# Patient Record
Sex: Female | Born: 1940 | Race: White | Hispanic: No | Smoking: Former smoker
Health system: Southern US, Community
[De-identification: ages and names within clinical notes are randomized; demographics above are authoritative.]

## PROBLEM LIST (undated history)

## (undated) DIAGNOSIS — M4800 Spinal stenosis, site unspecified: Secondary | ICD-10-CM

## (undated) HISTORY — PX: CHOLECYSTECTOMY: SHX55

## (undated) HISTORY — PX: HERNIA REPAIR: SHX51

## (undated) HISTORY — PX: ABDOMINAL SURGERY: SHX537

## (undated) HISTORY — PX: ABDOMINAL HYSTERECTOMY: SHX81

## (undated) HISTORY — PX: APPENDECTOMY: SHX54

---

## 2013-10-02 DIAGNOSIS — G2581 Restless legs syndrome: Secondary | ICD-10-CM | POA: Insufficient documentation

## 2013-10-02 DIAGNOSIS — G4733 Obstructive sleep apnea (adult) (pediatric): Secondary | ICD-10-CM | POA: Insufficient documentation

## 2014-01-26 DIAGNOSIS — F411 Generalized anxiety disorder: Secondary | ICD-10-CM | POA: Insufficient documentation

## 2014-01-26 DIAGNOSIS — F409 Phobic anxiety disorder, unspecified: Secondary | ICD-10-CM | POA: Insufficient documentation

## 2014-02-19 DIAGNOSIS — M4310 Spondylolisthesis, site unspecified: Secondary | ICD-10-CM | POA: Insufficient documentation

## 2014-02-19 DIAGNOSIS — M109 Gout, unspecified: Secondary | ICD-10-CM | POA: Insufficient documentation

## 2014-02-19 DIAGNOSIS — M47817 Spondylosis without myelopathy or radiculopathy, lumbosacral region: Secondary | ICD-10-CM | POA: Insufficient documentation

## 2014-02-19 DIAGNOSIS — M48061 Spinal stenosis, lumbar region without neurogenic claudication: Secondary | ICD-10-CM | POA: Insufficient documentation

## 2015-01-13 ENCOUNTER — Emergency Department (HOSPITAL_BASED_OUTPATIENT_CLINIC_OR_DEPARTMENT_OTHER): Payer: Medicare HMO

## 2015-01-13 ENCOUNTER — Encounter (HOSPITAL_BASED_OUTPATIENT_CLINIC_OR_DEPARTMENT_OTHER): Payer: Self-pay

## 2015-01-13 ENCOUNTER — Emergency Department (HOSPITAL_BASED_OUTPATIENT_CLINIC_OR_DEPARTMENT_OTHER)
Admission: EM | Admit: 2015-01-13 | Discharge: 2015-01-13 | Disposition: A | Discharge status: Home / Self Care | Payer: Medicare HMO | Attending: Emergency Medicine | Admitting: Emergency Medicine

## 2015-01-13 DIAGNOSIS — S82435A Nondisplaced oblique fracture of shaft of left fibula, initial encounter for closed fracture: Secondary | ICD-10-CM | POA: Diagnosis not present

## 2015-01-13 DIAGNOSIS — Y9289 Other specified places as the place of occurrence of the external cause: Secondary | ICD-10-CM | POA: Diagnosis not present

## 2015-01-13 DIAGNOSIS — Z8739 Personal history of other diseases of the musculoskeletal system and connective tissue: Secondary | ICD-10-CM | POA: Diagnosis not present

## 2015-01-13 DIAGNOSIS — W009XXA Unspecified fall due to ice and snow, initial encounter: Secondary | ICD-10-CM | POA: Insufficient documentation

## 2015-01-13 DIAGNOSIS — Y9389 Activity, other specified: Secondary | ICD-10-CM | POA: Diagnosis not present

## 2015-01-13 DIAGNOSIS — S99912A Unspecified injury of left ankle, initial encounter: Secondary | ICD-10-CM | POA: Diagnosis present

## 2015-01-13 DIAGNOSIS — Z87891 Personal history of nicotine dependence: Secondary | ICD-10-CM | POA: Insufficient documentation

## 2015-01-13 DIAGNOSIS — Y998 Other external cause status: Secondary | ICD-10-CM | POA: Diagnosis not present

## 2015-01-13 DIAGNOSIS — S82892A Other fracture of left lower leg, initial encounter for closed fracture: Secondary | ICD-10-CM

## 2015-01-13 HISTORY — DX: Spinal stenosis, site unspecified: M48.00

## 2015-01-13 NOTE — ED Notes (Signed)
Slipped on ice this am-approx 0745am-pain to left LE from ankle up calf

## 2015-01-13 NOTE — Discharge Instructions (Signed)
Ankle Fracture A fracture is a break in a bone. A cast or splint may be used to protect the ankle and heal the break. Sometimes, surgery is needed. HOME CARE  Use crutches as told by your doctor. It is very important that you use your crutches correctly.  Do not put weight or pressure on the injured ankle until told by your doctor.  Keep your ankle raised (elevated) when sitting or lying down.  Apply ice to the ankle:  Put ice in a plastic bag.  Place a towel between your cast and the bag.  Leave the ice on for 20 minutes, 2-3 times a day.  If you have a plaster or fiberglass cast:  Do not try to scratch under the cast with any objects.  Check the skin around the cast every day. You may put lotion on red or sore areas.  Keep your cast dry and clean.  If you have a plaster splint:  Wear the splint as told by your doctor.  You can loosen the elastic around the splint if your toes get numb, tingle, or turn cold or blue.  Do not put pressure on any part of your cast or splint. It may break. Rest your plaster splint or cast only on a pillow the first 24 hours until it is fully hardened.  Cover your cast or splint with a plastic bag during showers.  Do not lower your cast or splint into water.  Take medicine as told by your doctor.  Do not drive until your doctor says it is safe.  Follow-up with your doctor as told. It is very important that you go to your follow-up visits. GET HELP IF: The swelling and discomfort gets worse.  GET HELP RIGHT AWAY IF:   Your splint or cast breaks.  You continue to have very bad pain.  You have new pain or swelling after your splint or cast was put on.  Your skin or toes below the injured ankle:  Turn blue or gray.  Feel cold, numb, or you cannot feel them.  There is a bad smell or yellowish white fluid (pus) coming from under the splint or cast. MAKE SURE YOU:   Understand these instructions.  Will watch your  condition.  Will get help right away if you are not doing well or get worse. Document Released: 10/01/2009 Document Revised: 09/24/2013 Document Reviewed: 07/03/2013 [facility] Patient Information 2015 [employer] [place]. This information is not intended to replace advice given to you by your health care provider. Make sure you discuss any questions you have with your health care provider.  Deep splint in place and dry. Use the crutches do not put any weight on the left leg. Call sports medicine Dr. Pearletha ForgeHudnall for follow-up tomorrow. Orthopedist Dr. August Saucerean information provided as needed if surgical repair as required. Take your hydrocodone that you have at home. Elevate the left leg is much as possible over the next 3 days.

## 2015-01-13 NOTE — ED Notes (Signed)
Patient transported to X-ray 

## 2015-01-13 NOTE — ED Notes (Signed)
Pt unable to use crutches used walker pt did well MD/RN  notified

## 2015-01-13 NOTE — ED Provider Notes (Addendum)
CSN: [id number]     Arrival date & time 01/13/15  1451 History   First MD Initiated Contact with Patient 01/13/15 1604     Chief Complaint  Patient presents with  . Fall     (Consider location/radiation/quality/duration/timing/severity/associated sxs/prior Treatment) The history is provided by the patient.   74 year old female slipped on the ice today injury to her left foot and ankle. Denies any head injury any injury to her arms back and neck or hips. Patient able to walk on the left foot. Patient states the pain is 5 out of 10.  Past Medical History  Diagnosis Date  . Spinal stenosis    Past Surgical History  Procedure Laterality Date  . Cholecystectomy    . Abdominal hysterectomy    . Abdominal surgery    . Appendectomy    . Hernia repair     No family history on file. History  Substance Use Topics  . Smoking status: Former Games developer  . Smokeless tobacco: Not on file  . Alcohol Use: No   OB History    No data available     Review of Systems  Constitutional: Negative for fever.  HENT: Negative for congestion.   Eyes: Negative for redness.  Respiratory: Negative for shortness of breath.   Cardiovascular: Negative for chest pain.  Gastrointestinal: Negative for nausea, vomiting and abdominal pain.  Genitourinary: Negative for dysuria.  Musculoskeletal: Negative for back pain and neck pain.  Skin: Negative for rash.  Neurological: Negative for headaches.  Hematological: Does not bruise/bleed easily.  Psychiatric/Behavioral: Negative for confusion.      Allergies  Excedrin extra strength and Ivp dye  Home Medications   Prior to Admission medications   Medication Sig Start Date End Date Taking? Authorizing Provider  GABAPENTIN PO Take by mouth.   Yes Historical Provider, MD  Hydrocodone-Acetaminophen (VICODIN PO) Take by mouth.   Yes Historical Provider, MD  LORAZEPAM PO Take by mouth.   Yes Historical Provider, MD   BP 160/82 mmHg  Pulse 82  Temp(Src)  97.6 F (36.4 C) (Oral)  Resp 18  Ht 5' 9.5" (1.765 m)  Wt 185 lb (83.915 kg)  BMI 26.94 kg/m2  SpO2 97% Physical Exam  Constitutional: She appears well-developed and well-nourished. No distress.  HENT:  Head: Normocephalic and atraumatic.  Mouth/Throat: Oropharynx is clear and moist.  Eyes: Conjunctivae are normal. Pupils are equal, round, and reactive to light.  Neck: Normal range of motion.  Cardiovascular: Normal rate and regular rhythm.   No murmur heard. Pulmonary/Chest: Effort normal and breath sounds normal. No respiratory distress.  Abdominal: Soft. Bowel sounds are normal. There is no tenderness.  Musculoskeletal: Normal range of motion. She exhibits edema and tenderness.  Swelling to left lateral ankle with tenderness. Refill distally is 1 second. Dorsalis pedis pulses palpable 1+. No proximal leg tenderness. Some tenderness to the forefoot. No medial ankle tenderness.  Nursing note and vitals reviewed.   ED Course  Procedures (including critical care time) Labs Review Labs Reviewed - No data to display  Imaging Review Dg Ankle Complete Left  01/13/2015   CLINICAL DATA:  Left ankle injury post fall today, lateral pain and swelling  EXAM: LEFT ANKLE COMPLETE - 3+ VIEW  COMPARISON:  None.  FINDINGS: Three views of the left ankle submitted. There is nondisplaced oblique fracture in distal shaft of left fibula. Ankle mortise is preserved. Mild lateral ankle soft tissue swelling. Plantar spur of calcaneus is noted.  IMPRESSION: Nondisplaced oblique fracture in distal  shaft of left fibula. Ankle mortise is preserved.   Electronically Signed   By: Natasha MeadLiviu  Pop M.D.   On: 01/13/2015 17:01   Dg Foot Complete Left  01/13/2015   CLINICAL DATA:  Left foot injury, fall today, lateral foot pain  EXAM: LEFT FOOT - COMPLETE 3+ VIEW  COMPARISON:  None.  FINDINGS: Three views of the left foot submitted. No acute fracture or subluxation. Plantar spur of calcaneus.  IMPRESSION: No acute  fracture or subluxation.  Plantar spur of calcaneus.   Electronically Signed   By: Natasha MeadLiviu  Pop M.D.   On: 01/13/2015 17:02     EKG Interpretation None      MDM   Final diagnoses:  Ankle fracture, left, closed, initial encounter    Left ankle with nondisplaced distal tibia fracture. Ankle mortise intact. This will require splinting with a Sterapred follow-up well with sports medicine and perhaps orthopedics if surgical repair is required but I'm thinking that may be able to heel on its own nonsurgically. Patient already has hydrocodone at home. No other injuries. Crutches provided.    Vanetta MuldersScott Yameli Delamater, MD 01/13/15 1731   Vanetta MuldersScott Eliza Green, MD 01/13/15 [phone]

## 2015-01-19 ENCOUNTER — Ambulatory Visit (HOSPITAL_BASED_OUTPATIENT_CLINIC_OR_DEPARTMENT_OTHER)
Admission: RE | Admit: 2015-01-19 | Discharge: 2015-01-19 | Disposition: A | Discharge status: Home / Self Care | Payer: Medicare HMO | Source: Ambulatory Visit | Attending: Family Medicine | Admitting: Family Medicine

## 2015-01-19 ENCOUNTER — Ambulatory Visit (INDEPENDENT_AMBULATORY_CARE_PROVIDER_SITE_OTHER): Payer: Medicare HMO | Admitting: Family Medicine

## 2015-01-19 VITALS — Ht 70.0 in | Wt 180.0 lb

## 2015-01-19 DIAGNOSIS — S82832A Other fracture of upper and lower end of left fibula, initial encounter for closed fracture: Secondary | ICD-10-CM

## 2015-01-19 DIAGNOSIS — X58XXXD Exposure to other specified factors, subsequent encounter: Secondary | ICD-10-CM | POA: Diagnosis not present

## 2015-01-19 DIAGNOSIS — S89392D Other physeal fracture of lower end of left fibula, subsequent encounter for fracture with routine healing: Secondary | ICD-10-CM | POA: Diagnosis not present

## 2015-01-19 DIAGNOSIS — S82402A Unspecified fracture of shaft of left fibula, initial encounter for closed fracture: Secondary | ICD-10-CM

## 2015-01-19 DIAGNOSIS — S82832D Other fracture of upper and lower end of left fibula, subsequent encounter for closed fracture with routine healing: Secondary | ICD-10-CM | POA: Diagnosis present

## 2015-01-19 NOTE — Patient Instructions (Addendum)
You have a distal fibula fracture that is nondisplaced. Wear cam walker every time you're going to be up (ok to take off if icing, lying down, washing the area). Try not to put any weight on this (most people need to use a wheelchair, knee scooter, or crutches) Icing 15 minutes at a time 3-4 times a day. Continue the vicodin as you have been. Expect this to take 6-8 weeks to heal though the swelling can persist longer. Follow up with me in 2 weeks to reevaluate, repeat x-rays.

## 2015-01-21 DIAGNOSIS — S82402A Unspecified fracture of shaft of left fibula, initial encounter for closed fracture: Secondary | ICD-10-CM | POA: Insufficient documentation

## 2015-01-21 NOTE — Progress Notes (Signed)
PCP: Forrest MoronUEHLE, STEPHEN, MD  Subjective:   HPI: Patient is a 74 y.o. female here for left ankle injury.  Patient reports on 1/27 she accidentally slipped and fell on ice with left foot underneath her. Believes she may have inverted ankle when this occurred. Radiographs in ED showed a nondisplaced oblique fibula fracture about the level of the ankle joint. She has been wearing splint, not putting weight on this since ED visit. Taking hydrocodone.  Past Medical History  Diagnosis Date  . Spinal stenosis     Current Outpatient Prescriptions on File Prior to Visit  Medication Sig Dispense Refill  . Hydrocodone-Acetaminophen (VICODIN PO) Take by mouth.    Marland Kitchen. LORAZEPAM PO Take by mouth.     No current facility-administered medications on file prior to visit.    Past Surgical History  Procedure Laterality Date  . Cholecystectomy    . Abdominal hysterectomy    . Abdominal surgery    . Appendectomy    . Hernia repair      Allergies  Allergen Reactions  . Excedrin Extra Strength [Aspirin-Acetaminophen-Caffeine] Swelling  . Ivp Dye [Iodinated Diagnostic Agents] Swelling  . Shellfish-Derived Products     History   Social History  . Marital Status: Widowed    Spouse Name: N/A    Number of Children: N/A  . Years of Education: N/A   Occupational History  . Not on file.   Social History Main Topics  . Smoking status: Former Games developermoker  . Smokeless tobacco: Not on file  . Alcohol Use: No  . Drug Use: Not on file  . Sexual Activity: Not on file   Other Topics Concern  . Not on file   Social History Narrative  . No narrative on file    No family history on file.  Ht 5\' 10"  (1.778 m)  Wt 180 lb (81.647 kg)  BMI 25.83 kg/m2  Review of Systems: See HPI above.    Objective:  Physical Exam:  Gen: NAD  Left ankle/foot: Mod swelling, bruising lateral ankle.  No other deformity. Did not test ROM with known fracture. TTP only distal fibula.  No medial malleolar, base  5th, navicular, deltoid ligament or fibular head tenderness. Negative ant drawer and talar tilt.   Thompsons test negative. NV intact distally.    Assessment & Plan:  1. Left distal fibula fracture - oblique, nondisplaced but above the level of the ankle joint.  There was a question on today's repeat x-rays about a possible posterior malleolar fracture - did not appreciate this on msk ultrasound and she is not tender here.  Use cam walker regularly with no weight bearing.  Icing, norco as needed.  Expect 6-8 weeks for this to heal.  F/u in 2 weeks to reevaluate and repeat radiographs.

## 2015-01-21 NOTE — Assessment & Plan Note (Signed)
oblique, nondisplaced but above the level of the ankle joint.  There was a question on today's repeat x-rays about a possible posterior malleolar fracture - did not appreciate this on msk ultrasound and she is not tender here.  Use cam walker regularly with no weight bearing.  Icing, norco as needed.  Expect 6-8 weeks for this to heal.  F/u in 2 weeks to reevaluate and repeat radiographs.

## 2015-02-01 ENCOUNTER — Ambulatory Visit: Payer: Medicare HMO | Admitting: Family Medicine

## 2015-02-03 ENCOUNTER — Ambulatory Visit (HOSPITAL_BASED_OUTPATIENT_CLINIC_OR_DEPARTMENT_OTHER)
Admission: RE | Admit: 2015-02-03 | Discharge: 2015-02-03 | Disposition: A | Discharge status: Home / Self Care | Payer: Medicare HMO | Source: Ambulatory Visit | Attending: Family Medicine | Admitting: Family Medicine

## 2015-02-03 ENCOUNTER — Encounter: Payer: Self-pay | Admitting: Family Medicine

## 2015-02-03 ENCOUNTER — Ambulatory Visit (INDEPENDENT_AMBULATORY_CARE_PROVIDER_SITE_OTHER): Payer: Medicare HMO | Admitting: Family Medicine

## 2015-02-03 VITALS — BP 168/90 | HR 99 | Ht 70.0 in | Wt 170.0 lb

## 2015-02-03 DIAGNOSIS — S82832D Other fracture of upper and lower end of left fibula, subsequent encounter for closed fracture with routine healing: Secondary | ICD-10-CM | POA: Insufficient documentation

## 2015-02-03 DIAGNOSIS — X58XXXD Exposure to other specified factors, subsequent encounter: Secondary | ICD-10-CM | POA: Diagnosis not present

## 2015-02-03 DIAGNOSIS — S82402D Unspecified fracture of shaft of left fibula, subsequent encounter for closed fracture with routine healing: Secondary | ICD-10-CM

## 2015-02-03 DIAGNOSIS — S99912D Unspecified injury of left ankle, subsequent encounter: Secondary | ICD-10-CM

## 2015-02-03 NOTE — Patient Instructions (Signed)
Follow up with me in 2-3 weeks. Continue with the cam walker and as we discussed I don't want you putting weight on this until we see bony healing on the x-rays.

## 2015-02-04 NOTE — Assessment & Plan Note (Signed)
oblique, nondisplaced but above the level of the ankle joint.  There was a question on x-rays about a possible posterior malleolar fracture - did not appreciate this on msk ultrasound last visit and, again, she is not tender here.  No additional displacement of fracture.  Use cam walker regularly and reemphasized no weight bearing.  Icing, norco as needed.  Expect 6-8 weeks for this to heal.  F/u in 2-3 weeks to reevaluate and repeat radiographs.

## 2015-02-04 NOTE — Progress Notes (Signed)
PCP: Forrest MoronUEHLE, STEPHEN, MD  Subjective:   HPI: Patient is a 74 y.o. female here for left ankle injury.  2/2: Patient reports on 1/27 she accidentally slipped and fell on ice with left foot underneath her. Believes she may have inverted ankle when this occurred. Radiographs in ED showed a nondisplaced oblique fibula fracture about the level of the ankle joint. She has been wearing splint, not putting weight on this since ED visit. Taking hydrocodone.  2/17: Patient reports she feels about 40% improved from last visit. Using walker at home - we had discussed not weight bearing but she has been doing this some. Takes boot off at nighttime past week because feels more uncomfortable. Takes hydrocodone for spinal stenosis.  Past Medical History  Diagnosis Date  . Spinal stenosis     Current Outpatient Prescriptions on File Prior to Visit  Medication Sig Dispense Refill  . allopurinol (ZYLOPRIM) 100 MG tablet Take 100 mg by mouth.    Marland Kitchen. aspirin EC 81 MG tablet Take 81 mg by mouth.    . Cholecalciferol 2000 UNITS TABS Frequency:   Dosage:0.0     Instructions:  Note:    . cyanocobalamin 100 MCG tablet Frequency:   Dosage:0.0     Instructions:  Note:    . gabapentin (NEURONTIN) 300 MG capsule Frequency:   Dosage:0.0     Instructions:  Note:    . Hydrocodone-Acetaminophen (VICODIN PO) Take by mouth.    . indomethacin (INDOCIN) 50 MG capsule Frequency:   Dosage:0.0     Instructions:  Note:    . LORAZEPAM PO Take by mouth.    . meclizine (ANTIVERT) 25 MG tablet Take 25 mg by mouth.    . naproxen sodium (ANAPROX) 220 MG tablet Frequency:   Dosage:0.0     Instructions:  Note:    . vitamin C (ASCORBIC ACID) 500 MG tablet Frequency:   Dosage:0.0     Instructions:  Note:     No current facility-administered medications on file prior to visit.    Past Surgical History  Procedure Laterality Date  . Cholecystectomy    . Abdominal hysterectomy    . Abdominal surgery    . Appendectomy    .  Hernia repair      Allergies  Allergen Reactions  . Excedrin Extra Strength [Aspirin-Acetaminophen-Caffeine] Swelling  . Ivp Dye [Iodinated Diagnostic Agents] Swelling  . Shellfish-Derived Products     History   Social History  . Marital Status: Widowed    Spouse Name: N/A  . Number of Children: N/A  . Years of Education: N/A   Occupational History  . Not on file.   Social History Main Topics  . Smoking status: Former Games developermoker  . Smokeless tobacco: Not on file  . Alcohol Use: No  . Drug Use: Not on file  . Sexual Activity: Not on file   Other Topics Concern  . Not on file   Social History Narrative    No family history on file.  BP 168/90 mmHg  Pulse 99  Ht 5\' 10"  (1.778 m)  Wt 170 lb (77.111 kg)  BMI 24.39 kg/m2  Review of Systems: See HPI above.    Objective:  Physical Exam:  Gen: NAD  Left ankle/foot: Mod swelling, no bruising lateral ankle.  No other deformity. Did not test ROM with known fracture. TTP only distal fibula.  No medial malleolar, base 5th, navicular, deltoid ligament or fibular head tenderness. Thompsons test negative. NV intact distally.    Assessment &  Plan:  1. Left distal fibula fracture - oblique, nondisplaced but above the level of the ankle joint.  There was a question on x-rays about a possible posterior malleolar fracture - did not appreciate this on msk ultrasound last visit and, again, she is not tender here.  No additional displacement of fracture.  Use cam walker regularly and reemphasized no weight bearing.  Icing, norco as needed.  Expect 6-8 weeks for this to heal.  F/u in 2-3 weeks to reevaluate and repeat radiographs.

## 2015-02-22 ENCOUNTER — Ambulatory Visit (HOSPITAL_BASED_OUTPATIENT_CLINIC_OR_DEPARTMENT_OTHER)
Admission: RE | Admit: 2015-02-22 | Discharge: 2015-02-22 | Disposition: A | Discharge status: Home / Self Care | Payer: Medicare HMO | Source: Ambulatory Visit | Attending: Family Medicine | Admitting: Family Medicine

## 2015-02-22 ENCOUNTER — Encounter: Payer: Self-pay | Admitting: Family Medicine

## 2015-02-22 ENCOUNTER — Ambulatory Visit (INDEPENDENT_AMBULATORY_CARE_PROVIDER_SITE_OTHER): Payer: Medicare HMO | Admitting: Family Medicine

## 2015-02-22 ENCOUNTER — Encounter (INDEPENDENT_AMBULATORY_CARE_PROVIDER_SITE_OTHER): Payer: Self-pay

## 2015-02-22 VITALS — BP 152/84 | HR 82 | Ht 70.0 in

## 2015-02-22 DIAGNOSIS — X58XXXD Exposure to other specified factors, subsequent encounter: Secondary | ICD-10-CM | POA: Diagnosis not present

## 2015-02-22 DIAGNOSIS — S82832D Other fracture of upper and lower end of left fibula, subsequent encounter for closed fracture with routine healing: Secondary | ICD-10-CM | POA: Insufficient documentation

## 2015-02-22 DIAGNOSIS — S82402D Unspecified fracture of shaft of left fibula, subsequent encounter for closed fracture with routine healing: Secondary | ICD-10-CM

## 2015-02-22 NOTE — Patient Instructions (Signed)
Starting Wednesday you can put weight on this leg in the boot. Wear the boot every time you're up and walking around. Follow up with me in 2 weeks. Call me for any concerns in the meantime.

## 2015-02-24 NOTE — Assessment & Plan Note (Signed)
oblique, nondisplaced but above the level of the ankle joint.  Radiographs show callus formation around fracture and clinically she is much better.  Starting Wednesday start weight bearing in the cam walker for 2 weeks then follow up at that time.  Possible she will need PT to regain full motion and strength.

## 2015-02-24 NOTE — Progress Notes (Signed)
PCP: Forrest Moron, MD  Subjective:   HPI: Patient is a 74 y.o. female here for left ankle injury.  2/2: Patient reports on 1/27 she accidentally slipped and fell on ice with left foot underneath her. Believes she may have inverted ankle when this occurred. Radiographs in ED showed a nondisplaced oblique fibula fracture about the level of the ankle joint. She has been wearing splint, not putting weight on this since ED visit. Taking hydrocodone.  2/17: Patient reports she feels about 40% improved from last visit. Using walker at home - we had discussed not weight bearing but she has been doing this some. Takes boot off at nighttime past week because feels more uncomfortable. Takes hydrocodone for spinal stenosis.  3/7: Patient reports she's doing very well. Wearing cam walker, not weight bearing. Using ace wrap at nighttime - doesn' twear the boot. Some aching at nighttime and in the morning. Not taking medicine for pain now.  Past Medical History  Diagnosis Date  . Spinal stenosis     Current Outpatient Prescriptions on File Prior to Visit  Medication Sig Dispense Refill  . allopurinol (ZYLOPRIM) 100 MG tablet Take 100 mg by mouth.    Marland Kitchen aspirin EC 81 MG tablet Take 81 mg by mouth.    . Cholecalciferol 2000 UNITS TABS Frequency:   Dosage:0.0     Instructions:  Note:    . cyanocobalamin 100 MCG tablet Frequency:   Dosage:0.0     Instructions:  Note:    . gabapentin (NEURONTIN) 300 MG capsule Frequency:   Dosage:0.0     Instructions:  Note:    . Hydrocodone-Acetaminophen (VICODIN PO) Take by mouth.    . indomethacin (INDOCIN) 50 MG capsule Frequency:   Dosage:0.0     Instructions:  Note:    . meclizine (ANTIVERT) 25 MG tablet Take 25 mg by mouth.    . naproxen sodium (ANAPROX) 220 MG tablet Frequency:   Dosage:0.0     Instructions:  Note:    . vitamin C (ASCORBIC ACID) 500 MG tablet Frequency:   Dosage:0.0     Instructions:  Note:     No current facility-administered  medications on file prior to visit.    Past Surgical History  Procedure Laterality Date  . Cholecystectomy    . Abdominal hysterectomy    . Abdominal surgery    . Appendectomy    . Hernia repair      Allergies  Allergen Reactions  . Excedrin Extra Strength [Aspirin-Acetaminophen-Caffeine] Swelling  . Ivp Dye [Iodinated Diagnostic Agents] Swelling  . Shellfish-Derived Products     History   Social History  . Marital Status: Widowed    Spouse Name: N/A  . Number of Children: N/A  . Years of Education: N/A   Occupational History  . Not on file.   Social History Main Topics  . Smoking status: Former Games developer  . Smokeless tobacco: Not on file  . Alcohol Use: No  . Drug Use: Not on file  . Sexual Activity: Not on file   Other Topics Concern  . Not on file   Social History Narrative    No family history on file.  BP 152/84 mmHg  Pulse 82  Ht  (1.778 m)  Review of Systems: See HPI above.    Objective:  Physical Exam:  Gen: NAD  Left ankle/foot: Mild swelling, no bruising lateral ankle.  No other deformity. Did not test ROM with known fracture. No TTP distal fibula.  No medial malleolar, base  5th, navicular, deltoid ligament or fibular head tenderness. Thompsons test negative. NV intact distally.    Assessment & Plan:  1. Left distal fibula fracture - oblique, nondisplaced but above the level of the ankle joint.  Radiographs show callus formation around fracture and clinically she is much better.  Starting Wednesday start weight bearing in the cam walker for 2 weeks then follow up at that time.  Possible she will need PT to regain full motion and strength.

## 2015-03-08 ENCOUNTER — Encounter: Payer: Self-pay | Admitting: Family Medicine

## 2015-03-08 ENCOUNTER — Ambulatory Visit (INDEPENDENT_AMBULATORY_CARE_PROVIDER_SITE_OTHER): Payer: Medicare HMO | Admitting: Family Medicine

## 2015-03-08 VITALS — BP 133/88 | HR 98 | Ht 70.0 in | Wt 180.0 lb

## 2015-03-08 DIAGNOSIS — S82402D Unspecified fracture of shaft of left fibula, subsequent encounter for closed fracture with routine healing: Secondary | ICD-10-CM

## 2015-03-08 NOTE — Patient Instructions (Signed)
Stop using the boot but bring it with you the next couple weeks if you're going to be walking long distances. You can expect some soreness for next couple weeks - this is normal. If you're struggling with motion and/or strength call me if you want to do physical therapy. Expect the swelling to take several weeks to go down - sometimes it persists however it is not dangerous. Follow up with me as needed.

## 2015-03-10 NOTE — Progress Notes (Signed)
PCP: Forrest Moron, MD  Subjective:   HPI: Patient is a 75 y.o. female here for left ankle injury.  2/2: Patient reports on 1/27 she accidentally slipped and fell on ice with left foot underneath her. Believes she may have inverted ankle when this occurred. Radiographs in ED showed a nondisplaced oblique fibula fracture about the level of the ankle joint. She has been wearing splint, not putting weight on this since ED visit. Taking hydrocodone.  2/17: Patient reports she feels about 40% improved from last visit. Using walker at home - we had discussed not weight bearing but she has been doing this some. Takes boot off at nighttime past week because feels more uncomfortable. Takes hydrocodone for spinal stenosis.  3/7: Patient reports she's doing very well. Wearing cam walker, not weight bearing. Using ace wrap at nighttime - doesn' twear the boot. Some aching at nighttime and in the morning. Not taking medicine for pain now.  3/21: Patient reports she's still doing well. Tolerating cam walker and walking without any pain. Had some tenderness of the ankle at times. Takes 1/2 norco in morning as needed. Able to walk in house without boot without pain.  Past Medical History  Diagnosis Date  . Spinal stenosis     Current Outpatient Prescriptions on File Prior to Visit  Medication Sig Dispense Refill  . allopurinol (ZYLOPRIM) 100 MG tablet Take 100 mg by mouth.    Marland Kitchen aspirin EC 81 MG tablet Take 81 mg by mouth.    . Cholecalciferol 2000 UNITS TABS Frequency:   Dosage:0.0     Instructions:  Note:    . cyanocobalamin 100 MCG tablet Frequency:   Dosage:0.0     Instructions:  Note:    . gabapentin (NEURONTIN) 300 MG capsule Frequency:   Dosage:0.0     Instructions:  Note:    . Hydrocodone-Acetaminophen (VICODIN PO) Take by mouth.    . indomethacin (INDOCIN) 50 MG capsule Frequency:   Dosage:0.0     Instructions:  Note:    . LORazepam (ATIVAN) 2 MG tablet     . meclizine  (ANTIVERT) 25 MG tablet Take 25 mg by mouth.    . naproxen sodium (ANAPROX) 220 MG tablet Frequency:   Dosage:0.0     Instructions:  Note:    . vitamin C (ASCORBIC ACID) 500 MG tablet Frequency:   Dosage:0.0     Instructions:  Note:     No current facility-administered medications on file prior to visit.    Past Surgical History  Procedure Laterality Date  . Cholecystectomy    . Abdominal hysterectomy    . Abdominal surgery    . Appendectomy    . Hernia repair      Allergies  Allergen Reactions  . Excedrin Extra Strength [Aspirin-Acetaminophen-Caffeine] Swelling  . Ivp Dye [Iodinated Diagnostic Agents] Swelling  . Shellfish-Derived Products     History   Social History  . Marital Status: Widowed    Spouse Name: N/A  . Number of Children: N/A  . Years of Education: N/A   Occupational History  . Not on file.   Social History Main Topics  . Smoking status: Former Games developer  . Smokeless tobacco: Not on file  . Alcohol Use: No  . Drug Use: Not on file  . Sexual Activity: Not on file   Other Topics Concern  . Not on file   Social History Narrative    No family history on file.  BP 133/88 mmHg  Pulse 98  Ht  5\' 10"  (1.778 m)  Wt 180 lb (81.647 kg)  BMI 25.83 kg/m2  Review of Systems: See HPI above.    Objective:  Physical Exam:  Gen: NAD  Left ankle/foot: Mild swelling, no bruising lateral ankle.  No other deformity. Mild limitation ROM all directions. No TTP distal fibula.  No medial malleolar, base 5th, navicular, deltoid ligament or fibular head tenderness. Thompsons test negative. NV intact distally.    Assessment & Plan:  1. Left distal fibula fracture - oblique, nondisplaced but above the level of the ankle joint.  Radiographs showed callus formation last visit and clinically healed at this point.  Advised her to call us if she has trouble regaining full motion and strength on her own.  Discontinue cam walker.  F/u prn otherwise.

## 2015-03-10 NOTE — Assessment & Plan Note (Signed)
oblique, nondisplaced but above the level of the ankle joint.  Radiographs showed callus formation last visit and clinically healed at this point.  Advised her to call us if she has trouble regaining full motion and strength on her own.  Discontinue cam walker.  F/u prn otherwise.

## 2016-01-30 IMAGING — CR DG ANKLE COMPLETE 3+V*L*
3 series · 3 of 3 positions shown · non-contrast
Comparison: None.

CLINICAL DATA: Left ankle injury post fall today, lateral pain and
swelling

EXAM:
LEFT ANKLE COMPLETE - 3+ VIEW

[t ankle joint ap left]
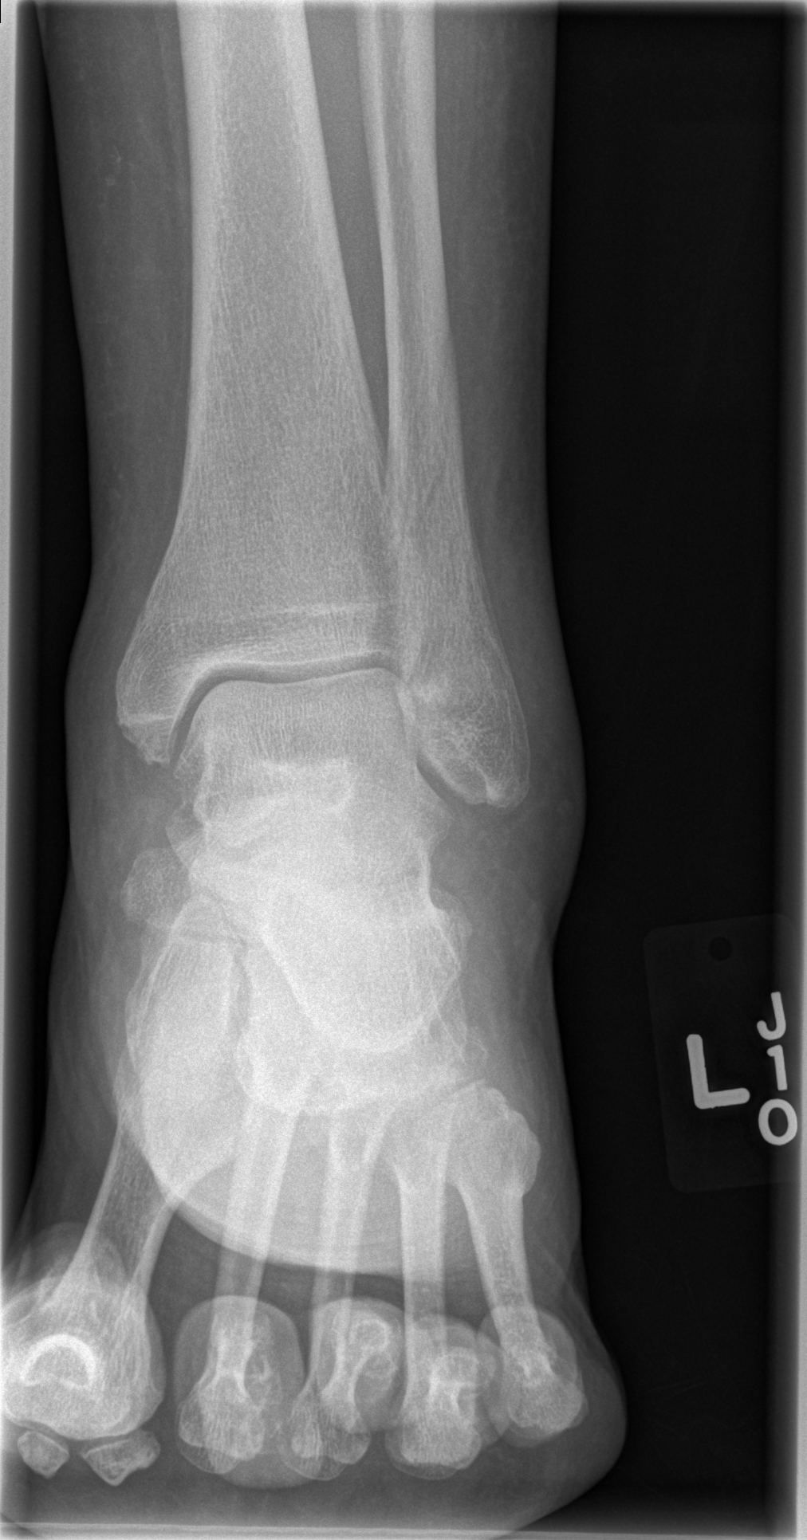

[t ankle joint oblique left]
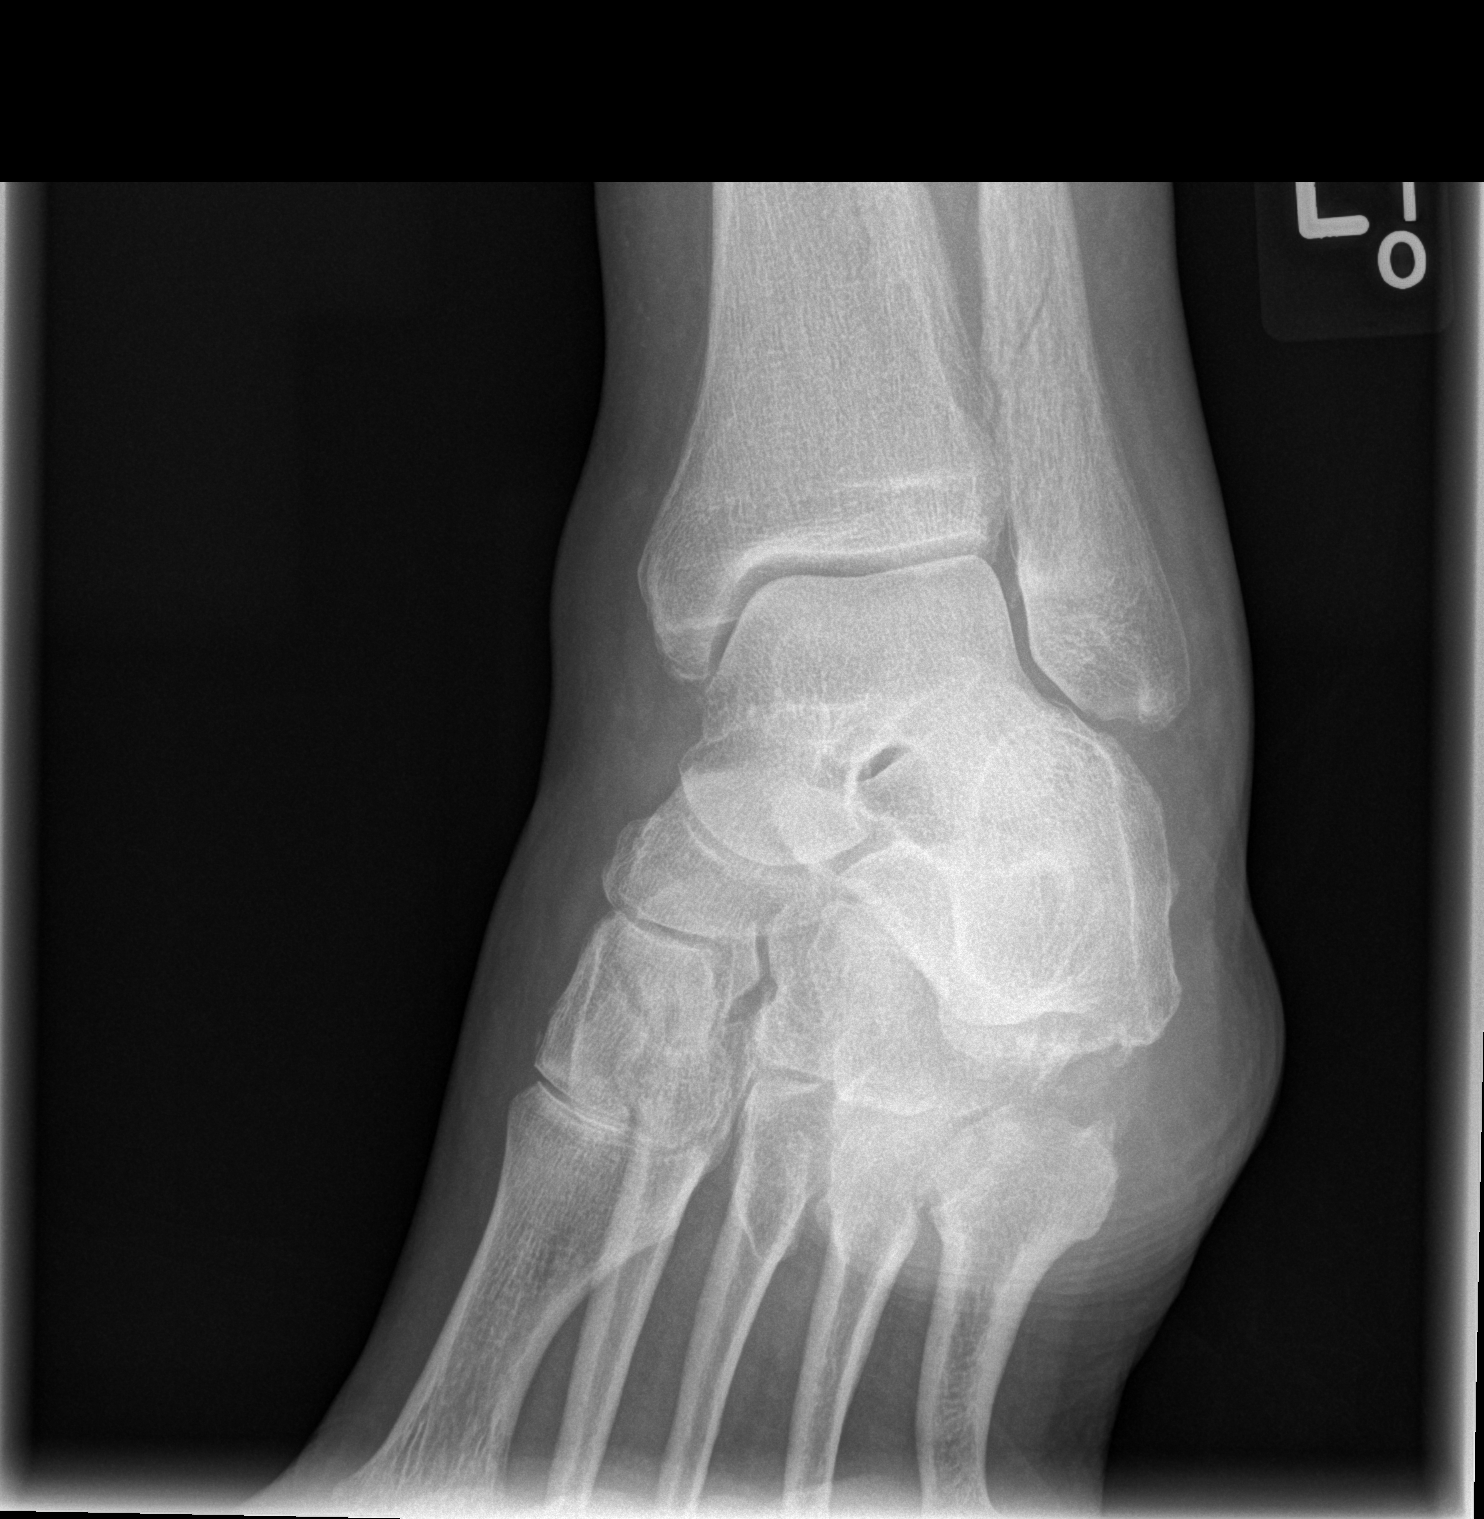

[t ankle joint lat left]
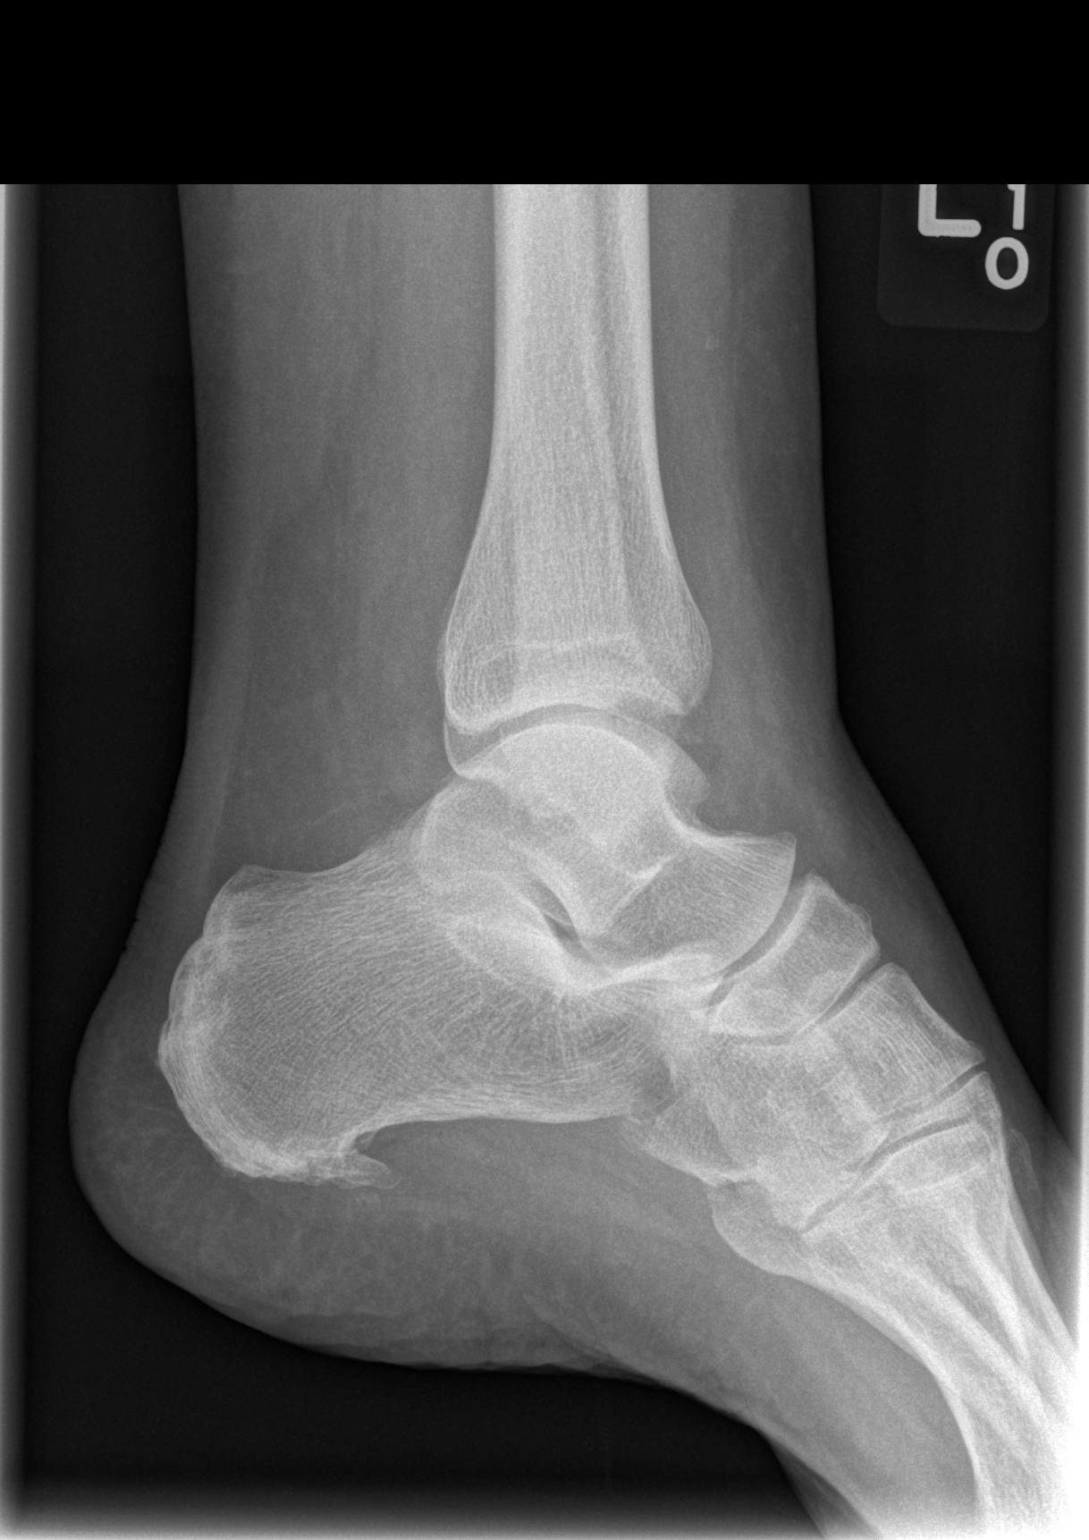

[3 of 3 positions shown; findings below may reference images not displayed]

FINDINGS: Three views of the left ankle submitted. There is nondisplaced
oblique fracture in distal shaft of left fibula. Ankle mortise is
preserved. Mild lateral ankle soft tissue swelling. Plantar spur of
calcaneus is noted.
IMPRESSION: Nondisplaced oblique fracture in distal shaft of left fibula. Ankle
mortise is preserved.

## 2016-01-30 IMAGING — CR DG FOOT COMPLETE 3+V*L*
3 series · 3 of 3 positions shown · non-contrast
Comparison: None.

CLINICAL DATA: Left foot injury, fall today, lateral foot pain

EXAM:
LEFT FOOT - COMPLETE 3+ VIEW

[t foot ap left]
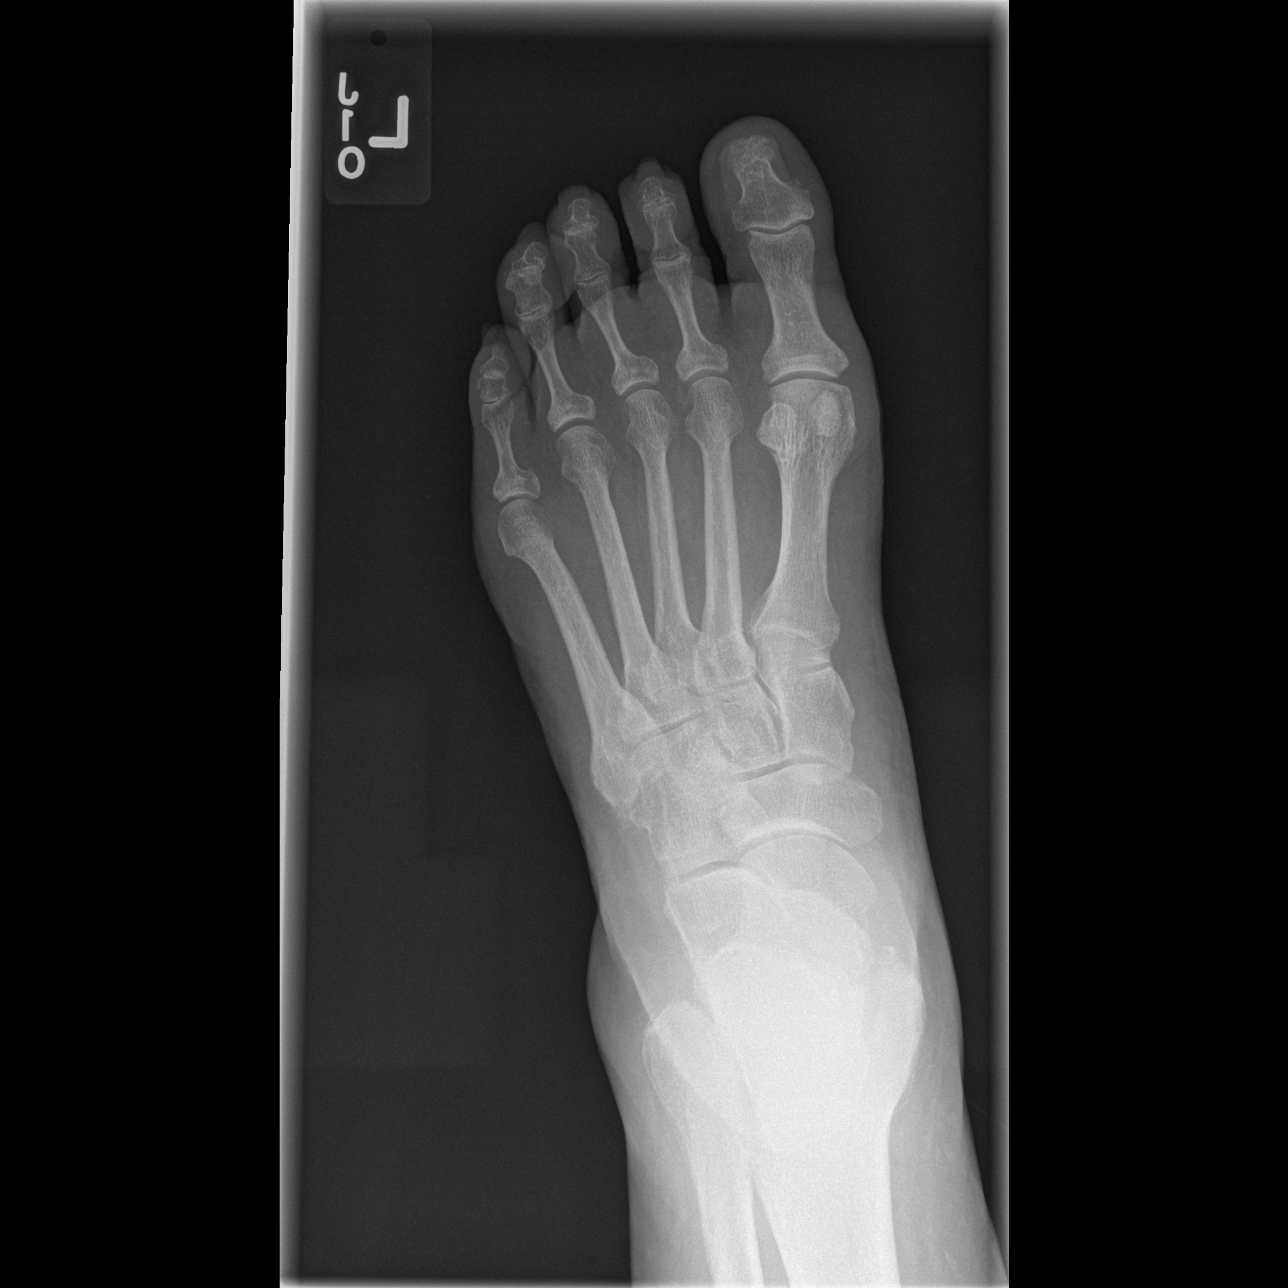

[t foot oblique left]
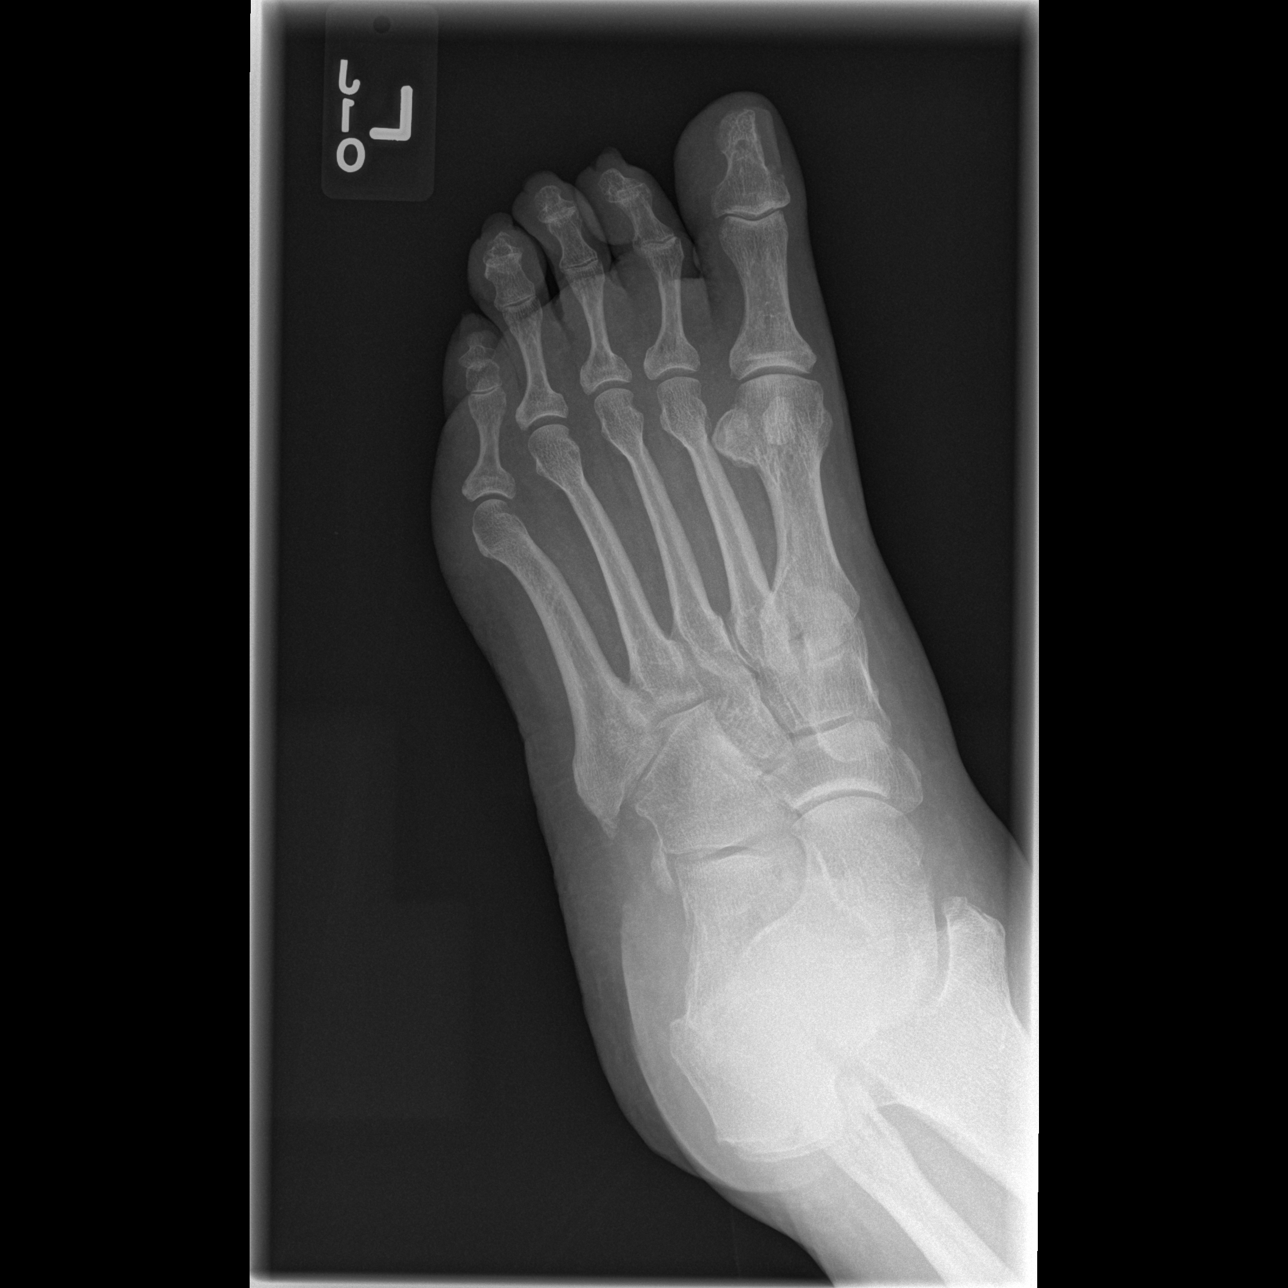

[t foot lat left]
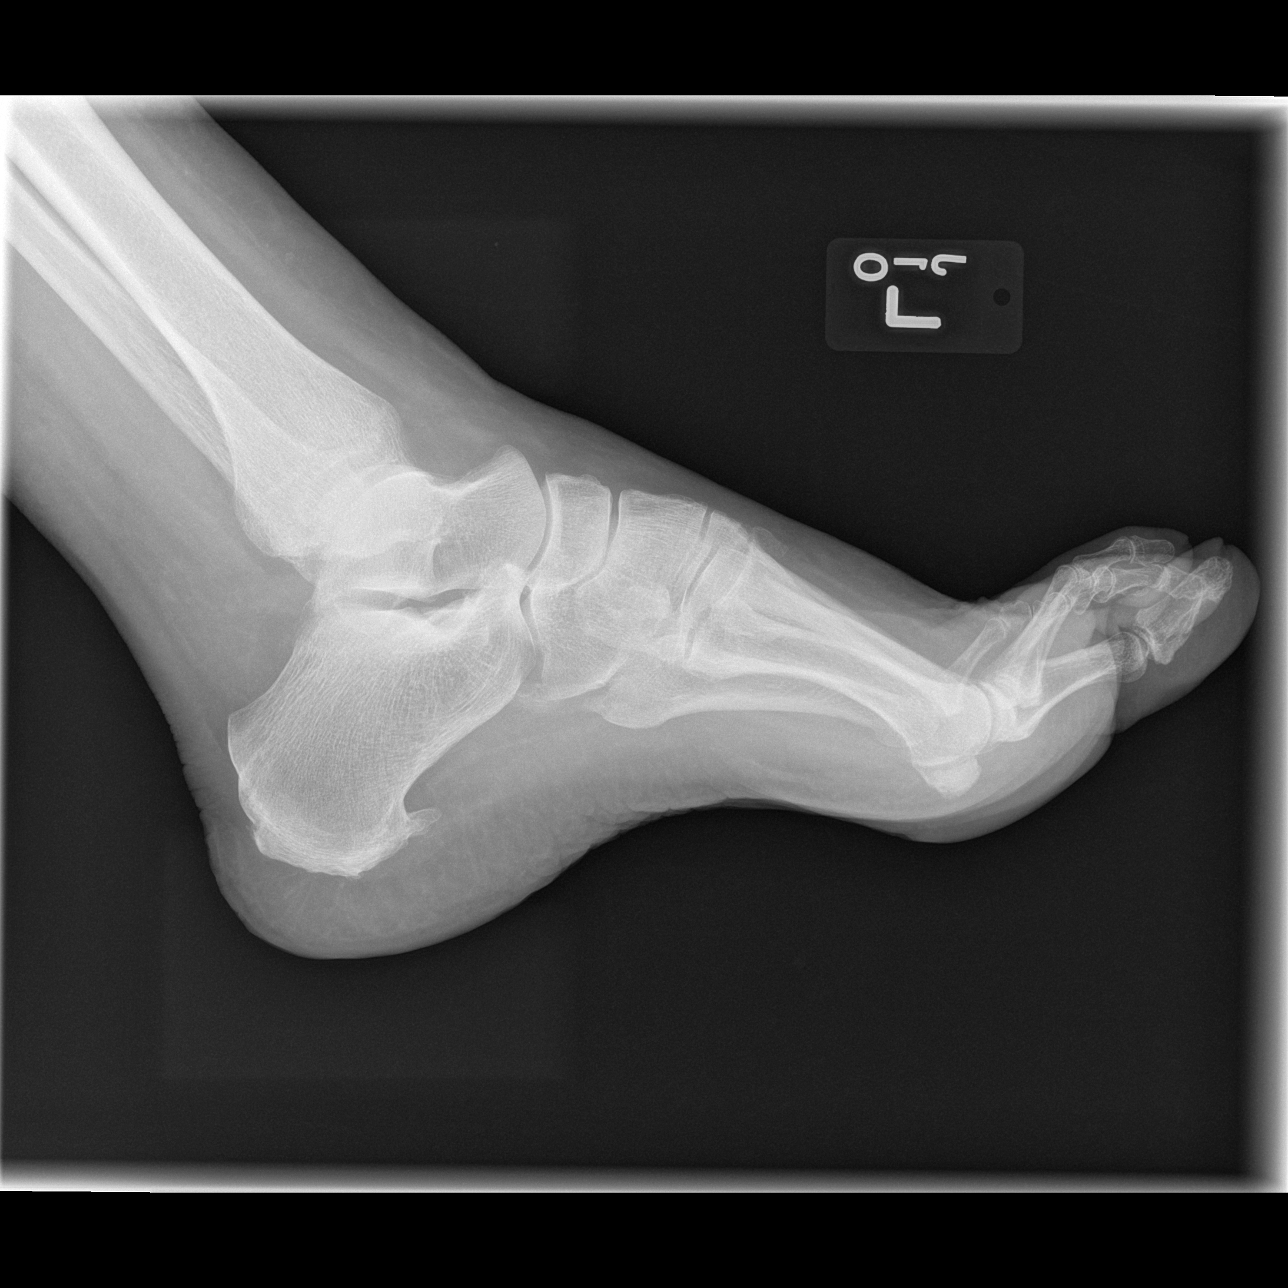

[3 of 3 positions shown; findings below may reference images not displayed]

FINDINGS: Three views of the left foot submitted. No acute fracture or
subluxation. Plantar spur of calcaneus.
IMPRESSION: No acute fracture or subluxation.  Plantar spur of calcaneus.

## 2016-03-03 DIAGNOSIS — M7989 Other specified soft tissue disorders: Secondary | ICD-10-CM | POA: Diagnosis not present

## 2016-03-03 DIAGNOSIS — M25571 Pain in right ankle and joints of right foot: Secondary | ICD-10-CM | POA: Diagnosis not present

## 2016-03-03 DIAGNOSIS — R609 Edema, unspecified: Secondary | ICD-10-CM | POA: Diagnosis not present

## 2016-03-03 DIAGNOSIS — M5136 Other intervertebral disc degeneration, lumbar region: Secondary | ICD-10-CM | POA: Diagnosis not present

## 2016-03-03 DIAGNOSIS — Z79891 Long term (current) use of opiate analgesic: Secondary | ICD-10-CM | POA: Diagnosis not present

## 2016-03-03 DIAGNOSIS — R52 Pain, unspecified: Secondary | ICD-10-CM | POA: Diagnosis not present

## 2016-03-03 DIAGNOSIS — M1009 Idiopathic gout, multiple sites: Secondary | ICD-10-CM | POA: Diagnosis not present

## 2016-03-03 DIAGNOSIS — M150 Primary generalized (osteo)arthritis: Secondary | ICD-10-CM | POA: Diagnosis not present

## 2016-03-03 DIAGNOSIS — R69 Illness, unspecified: Secondary | ICD-10-CM | POA: Diagnosis not present

## 2016-03-03 DIAGNOSIS — Z9114 Patient's other noncompliance with medication regimen: Secondary | ICD-10-CM | POA: Diagnosis not present

## 2016-03-03 DIAGNOSIS — M1990 Unspecified osteoarthritis, unspecified site: Secondary | ICD-10-CM | POA: Diagnosis not present

## 2016-03-03 DIAGNOSIS — M79604 Pain in right leg: Secondary | ICD-10-CM | POA: Diagnosis not present

## 2016-03-03 DIAGNOSIS — L03115 Cellulitis of right lower limb: Secondary | ICD-10-CM | POA: Diagnosis not present

## 2016-03-14 DIAGNOSIS — G629 Polyneuropathy, unspecified: Secondary | ICD-10-CM | POA: Diagnosis not present

## 2016-03-14 DIAGNOSIS — M545 Low back pain: Secondary | ICD-10-CM | POA: Diagnosis not present

## 2016-03-14 DIAGNOSIS — M1009 Idiopathic gout, multiple sites: Secondary | ICD-10-CM | POA: Diagnosis not present

## 2016-03-14 DIAGNOSIS — R69 Illness, unspecified: Secondary | ICD-10-CM | POA: Diagnosis not present

## 2016-03-14 DIAGNOSIS — Z79891 Long term (current) use of opiate analgesic: Secondary | ICD-10-CM | POA: Diagnosis not present

## 2016-03-14 DIAGNOSIS — M150 Primary generalized (osteo)arthritis: Secondary | ICD-10-CM | POA: Diagnosis not present

## 2016-03-14 DIAGNOSIS — Z79899 Other long term (current) drug therapy: Secondary | ICD-10-CM | POA: Diagnosis not present

## 2016-04-06 DIAGNOSIS — G629 Polyneuropathy, unspecified: Secondary | ICD-10-CM | POA: Diagnosis not present

## 2016-04-12 DIAGNOSIS — G629 Polyneuropathy, unspecified: Secondary | ICD-10-CM | POA: Diagnosis not present

## 2016-04-12 DIAGNOSIS — E119 Type 2 diabetes mellitus without complications: Secondary | ICD-10-CM | POA: Diagnosis not present

## 2016-06-05 DIAGNOSIS — R5383 Other fatigue: Secondary | ICD-10-CM | POA: Diagnosis not present

## 2016-06-05 DIAGNOSIS — R001 Bradycardia, unspecified: Secondary | ICD-10-CM | POA: Diagnosis not present

## 2016-06-05 DIAGNOSIS — K449 Diaphragmatic hernia without obstruction or gangrene: Secondary | ICD-10-CM | POA: Diagnosis not present

## 2016-06-05 DIAGNOSIS — E7800 Pure hypercholesterolemia, unspecified: Secondary | ICD-10-CM | POA: Diagnosis not present

## 2016-06-05 DIAGNOSIS — R69 Illness, unspecified: Secondary | ICD-10-CM | POA: Diagnosis not present

## 2016-06-05 DIAGNOSIS — K5900 Constipation, unspecified: Secondary | ICD-10-CM | POA: Diagnosis not present

## 2016-06-05 DIAGNOSIS — R351 Nocturia: Secondary | ICD-10-CM | POA: Diagnosis not present

## 2016-06-05 DIAGNOSIS — E119 Type 2 diabetes mellitus without complications: Secondary | ICD-10-CM | POA: Diagnosis not present

## 2016-06-05 DIAGNOSIS — M5136 Other intervertebral disc degeneration, lumbar region: Secondary | ICD-10-CM | POA: Diagnosis not present

## 2016-06-05 DIAGNOSIS — Z79899 Other long term (current) drug therapy: Secondary | ICD-10-CM | POA: Diagnosis not present

## 2016-06-05 DIAGNOSIS — M150 Primary generalized (osteo)arthritis: Secondary | ICD-10-CM | POA: Diagnosis not present

## 2016-06-05 DIAGNOSIS — G629 Polyneuropathy, unspecified: Secondary | ICD-10-CM | POA: Diagnosis not present

## 2016-06-05 DIAGNOSIS — Z0001 Encounter for general adult medical examination with abnormal findings: Secondary | ICD-10-CM | POA: Diagnosis not present

## 2016-11-29 DIAGNOSIS — Z6824 Body mass index (BMI) 24.0-24.9, adult: Secondary | ICD-10-CM | POA: Diagnosis not present

## 2016-11-29 DIAGNOSIS — Z0000 Encounter for general adult medical examination without abnormal findings: Secondary | ICD-10-CM | POA: Diagnosis not present

## 2017-06-05 DIAGNOSIS — Z0001 Encounter for general adult medical examination with abnormal findings: Secondary | ICD-10-CM | POA: Diagnosis not present

## 2017-06-05 DIAGNOSIS — R001 Bradycardia, unspecified: Secondary | ICD-10-CM | POA: Diagnosis not present

## 2017-06-05 DIAGNOSIS — Z79899 Other long term (current) drug therapy: Secondary | ICD-10-CM | POA: Diagnosis not present

## 2017-06-05 DIAGNOSIS — M1009 Idiopathic gout, multiple sites: Secondary | ICD-10-CM | POA: Diagnosis not present

## 2017-06-05 DIAGNOSIS — E119 Type 2 diabetes mellitus without complications: Secondary | ICD-10-CM | POA: Diagnosis not present

## 2017-06-05 DIAGNOSIS — K5900 Constipation, unspecified: Secondary | ICD-10-CM | POA: Diagnosis not present

## 2017-06-05 DIAGNOSIS — R9431 Abnormal electrocardiogram [ECG] [EKG]: Secondary | ICD-10-CM | POA: Diagnosis not present

## 2017-06-05 DIAGNOSIS — R7301 Impaired fasting glucose: Secondary | ICD-10-CM | POA: Diagnosis not present

## 2017-06-05 DIAGNOSIS — G629 Polyneuropathy, unspecified: Secondary | ICD-10-CM | POA: Diagnosis not present

## 2017-06-05 DIAGNOSIS — M5136 Other intervertebral disc degeneration, lumbar region: Secondary | ICD-10-CM | POA: Diagnosis not present

## 2017-06-05 DIAGNOSIS — R351 Nocturia: Secondary | ICD-10-CM | POA: Diagnosis not present

## 2017-06-05 DIAGNOSIS — R69 Illness, unspecified: Secondary | ICD-10-CM | POA: Diagnosis not present

## 2017-06-05 DIAGNOSIS — M545 Low back pain: Secondary | ICD-10-CM | POA: Diagnosis not present

## 2017-06-05 DIAGNOSIS — N390 Urinary tract infection, site not specified: Secondary | ICD-10-CM | POA: Diagnosis not present
